# Patient Record
Sex: Female | Born: 1935 | Race: Black or African American | Hispanic: No | Marital: Married
Health system: Southern US, Community
[De-identification: ages and names within clinical notes are randomized; demographics above are authoritative.]

---

## 2003-10-31 ENCOUNTER — Other Ambulatory Visit: Payer: Self-pay

## 2004-05-27 ENCOUNTER — Other Ambulatory Visit: Payer: Self-pay

## 2004-06-22 ENCOUNTER — Ambulatory Visit: Payer: Self-pay | Admitting: Internal Medicine

## 2005-02-16 ENCOUNTER — Ambulatory Visit: Payer: Self-pay | Admitting: Podiatry

## 2005-02-16 ENCOUNTER — Other Ambulatory Visit: Payer: Self-pay

## 2005-02-16 ENCOUNTER — Emergency Department: Payer: Self-pay | Admitting: Unknown Physician Specialty

## 2005-02-21 ENCOUNTER — Ambulatory Visit: Payer: Self-pay | Admitting: Internal Medicine

## 2005-04-14 ENCOUNTER — Ambulatory Visit: Payer: Self-pay | Admitting: Podiatry

## 2005-10-04 ENCOUNTER — Emergency Department: Payer: Self-pay | Admitting: Unknown Physician Specialty

## 2005-10-05 ENCOUNTER — Ambulatory Visit: Payer: Self-pay | Admitting: Internal Medicine

## 2006-01-13 ENCOUNTER — Emergency Department: Payer: Self-pay | Admitting: Emergency Medicine

## 2006-10-10 ENCOUNTER — Ambulatory Visit: Payer: Self-pay | Admitting: Internal Medicine

## 2006-11-28 IMAGING — CT CT CHEST W/ CM
1 series · 15 of 32 positions shown, 19 images · non-contrast
Comparison: none

REASON FOR EXAM: Eval for PE  SOB  Call Report [id number]
COMMENTS:

[Series 4: soft tissue · axial · 0.60mm/px · z∈[-270,-18]mm · 15 of 96 slices shown, 19 images]
[im 8/96  mediastinal]
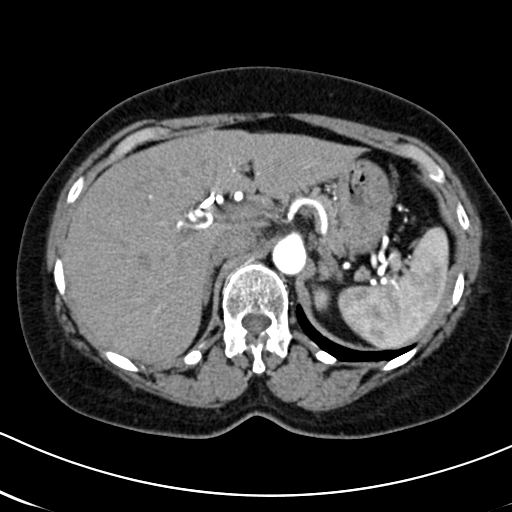
[im 8/96  lung]
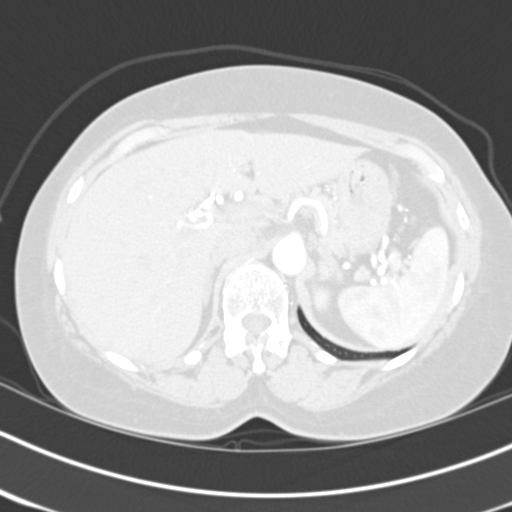
[im 15/96  lung]
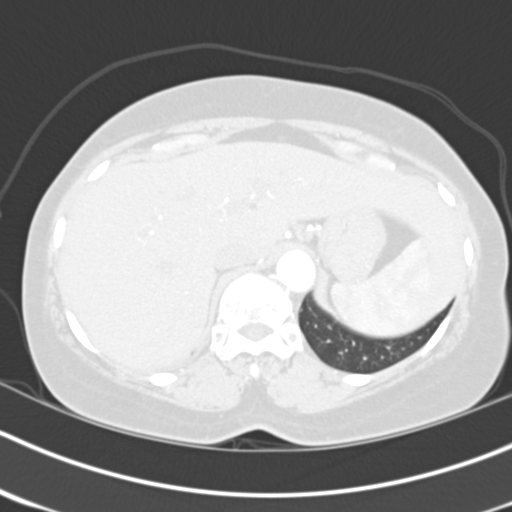
[im 20/96  lung]
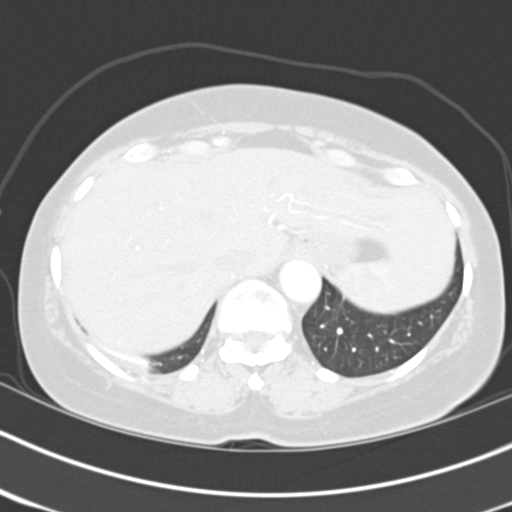
[im 25/96  lung]
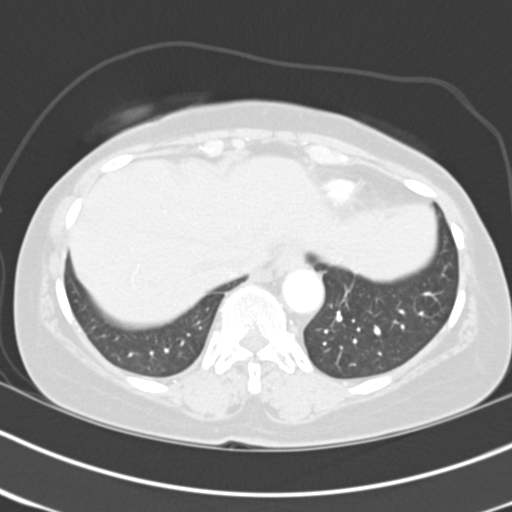
[im 32/96  mediastinal]
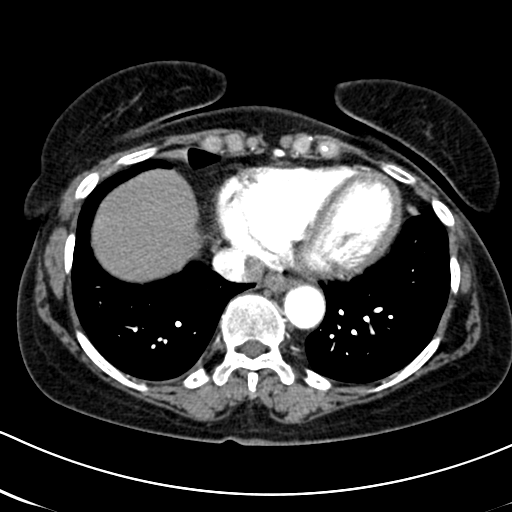
[im 32/96  lung]
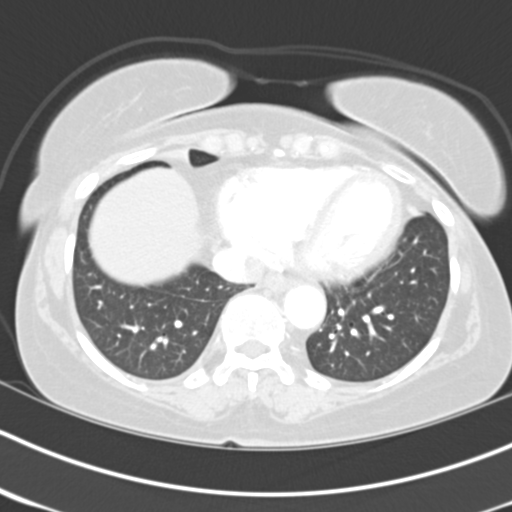
[im 39/96  lung]
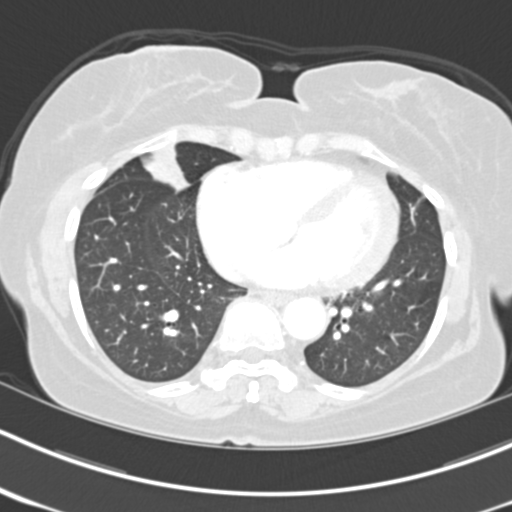
[im 46/96  lung]
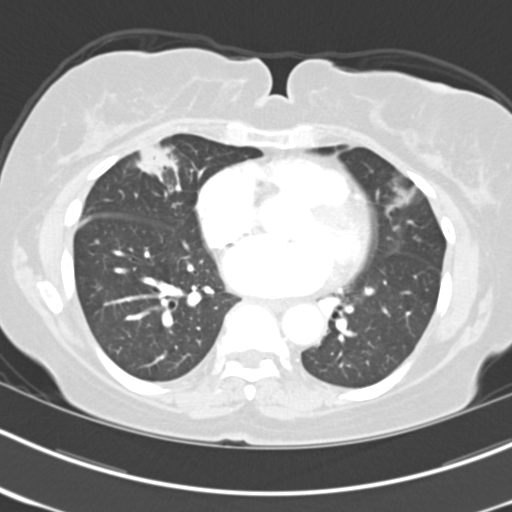
[im 51/96  lung]
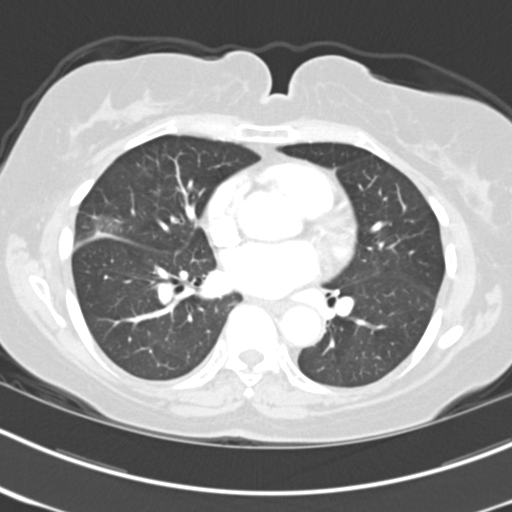
[im 57/96  mediastinal]
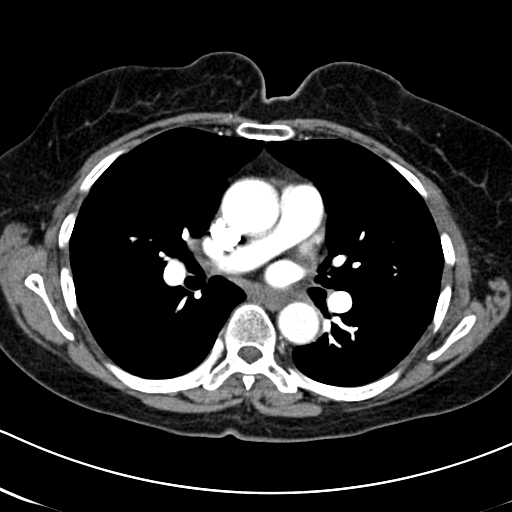
[im 57/96  lung]
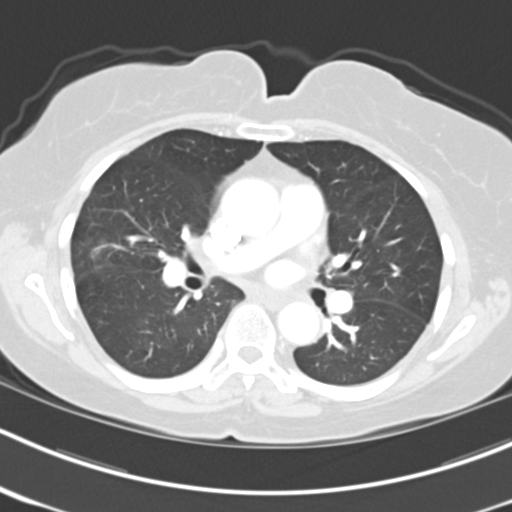
[im 60/96  lung]
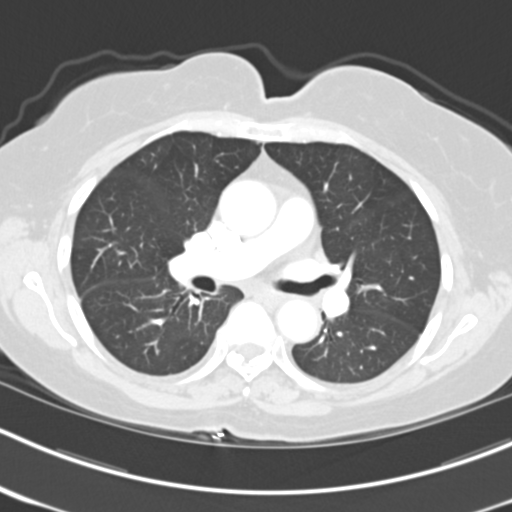
[im 67/96  lung]
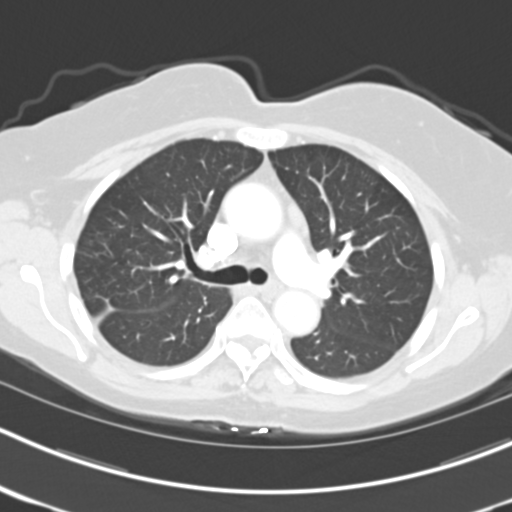
[im 74/96  lung]
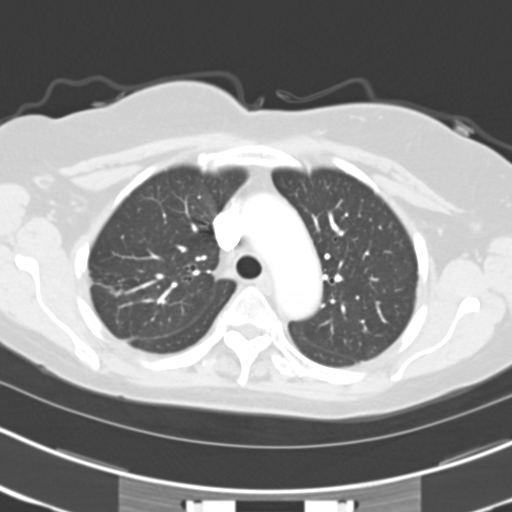
[im 78/96  mediastinal]
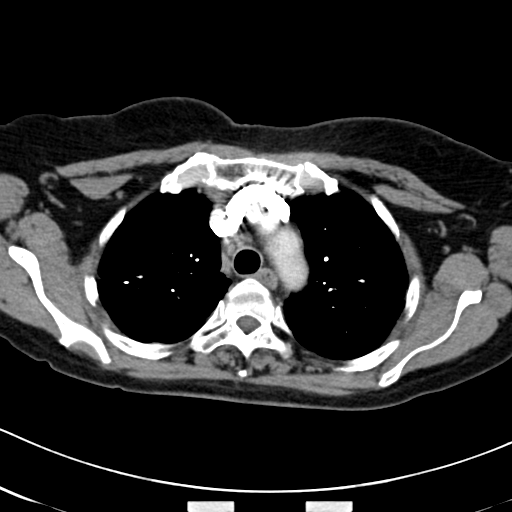
[im 78/96  lung]
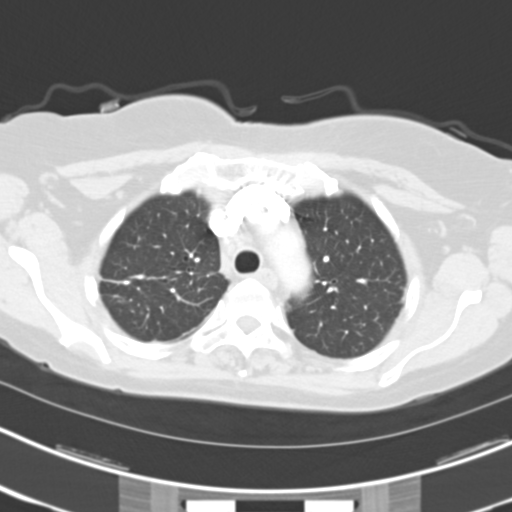
[im 85/96  lung]
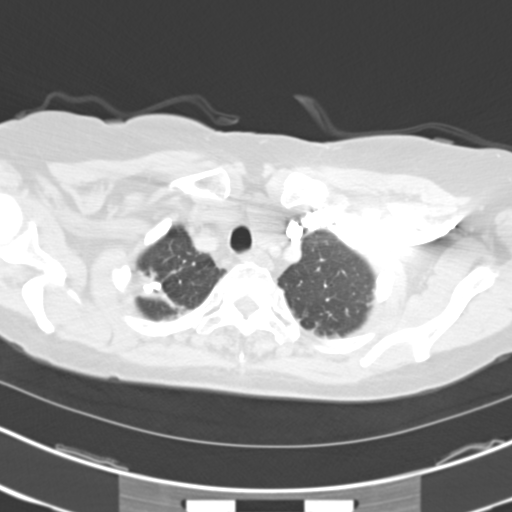
[im 92/96  lung]
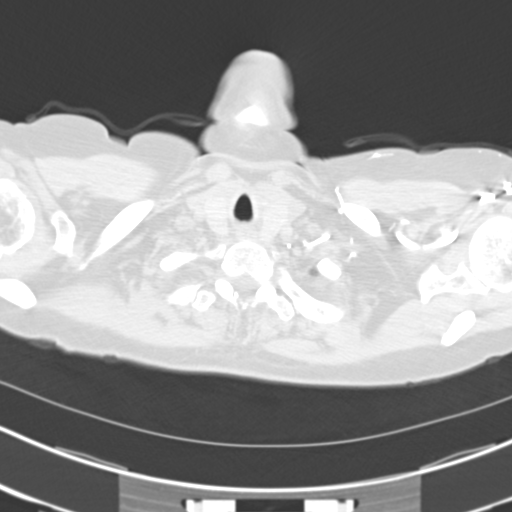

[15 of 32 positions shown; findings below may reference images not displayed]

PROCEDURE:     CT  - CT CHEST (FOR PE) W  - February 21, 2005  [DATE]

RESULT:     Standard IV contrast chest CT is obtained.  Multiple small
anterior and medial mediastinal lymph nodes are noted. These measure up to
1.5 cm and are nonspecific using CT criteria.  Similar findings are noted in
the hilar regions.  Pleural parenchymal thickening is noted bilaterally
consistent with scarring.  Calcifications are noted and RIGHT apical pleural
parenchymal thickening most likely secondary to prior granulomatous disease
and scarring.  Patchy densities are noted in the lung bases.  These are
present bilaterally and could represent focal areas of pneumonia.
Malignancy cannot be excluded.  Follow up chest CT is recommended to
demonstrate complete clearing.
IMPRESSION: 1.     No evidence of pulmonary embolus.
2.     Bilateral patchy pulmonary infiltrates, most consistent with
pneumonia.  Active granulomatous disease and malignancy cannot be excluded.
Follow up chest CT recommended to demonstrate complete clearing.
3.     Evidence of pleural parenchymal scarring and prior granulomatous
disease.
4.     Nonspecific lymph nodes in the mediastinum.

## 2007-11-22 ENCOUNTER — Ambulatory Visit: Payer: Self-pay | Admitting: Internal Medicine

## 2007-11-27 ENCOUNTER — Ambulatory Visit: Payer: Self-pay | Admitting: Gastroenterology

## 2008-11-25 ENCOUNTER — Ambulatory Visit: Payer: Self-pay | Admitting: Internal Medicine

## 2009-11-26 ENCOUNTER — Ambulatory Visit: Payer: Self-pay | Admitting: Internal Medicine

## 2010-08-28 ENCOUNTER — Emergency Department: Payer: Self-pay | Admitting: Unknown Physician Specialty

## 2010-11-10 ENCOUNTER — Emergency Department: Payer: Self-pay | Admitting: Emergency Medicine

## 2011-01-19 ENCOUNTER — Ambulatory Visit: Payer: Self-pay | Admitting: Internal Medicine

## 2011-06-17 ENCOUNTER — Ambulatory Visit: Payer: Self-pay | Admitting: Ophthalmology

## 2011-06-29 ENCOUNTER — Ambulatory Visit: Payer: Self-pay | Admitting: Ophthalmology

## 2011-07-17 ENCOUNTER — Emergency Department: Payer: Self-pay | Admitting: Internal Medicine

## 2012-06-03 IMAGING — CR DG LUMBAR SPINE 2-3V
1 series · 3 of 3 positions shown · non-contrast
Comparison: none

REASON FOR EXAM: back pain
COMMENTS:   LMP: Post-Menopausal

PROCEDURE:     DXR - DXR LUMBAR SPINE AP AND LATERAL  - August 28, 2010  [DATE]
RESULT:     Comparison: None

[Series 1: view not recorded · 0.17mm/px · 3 of 3 slices shown]
[im 1/3]
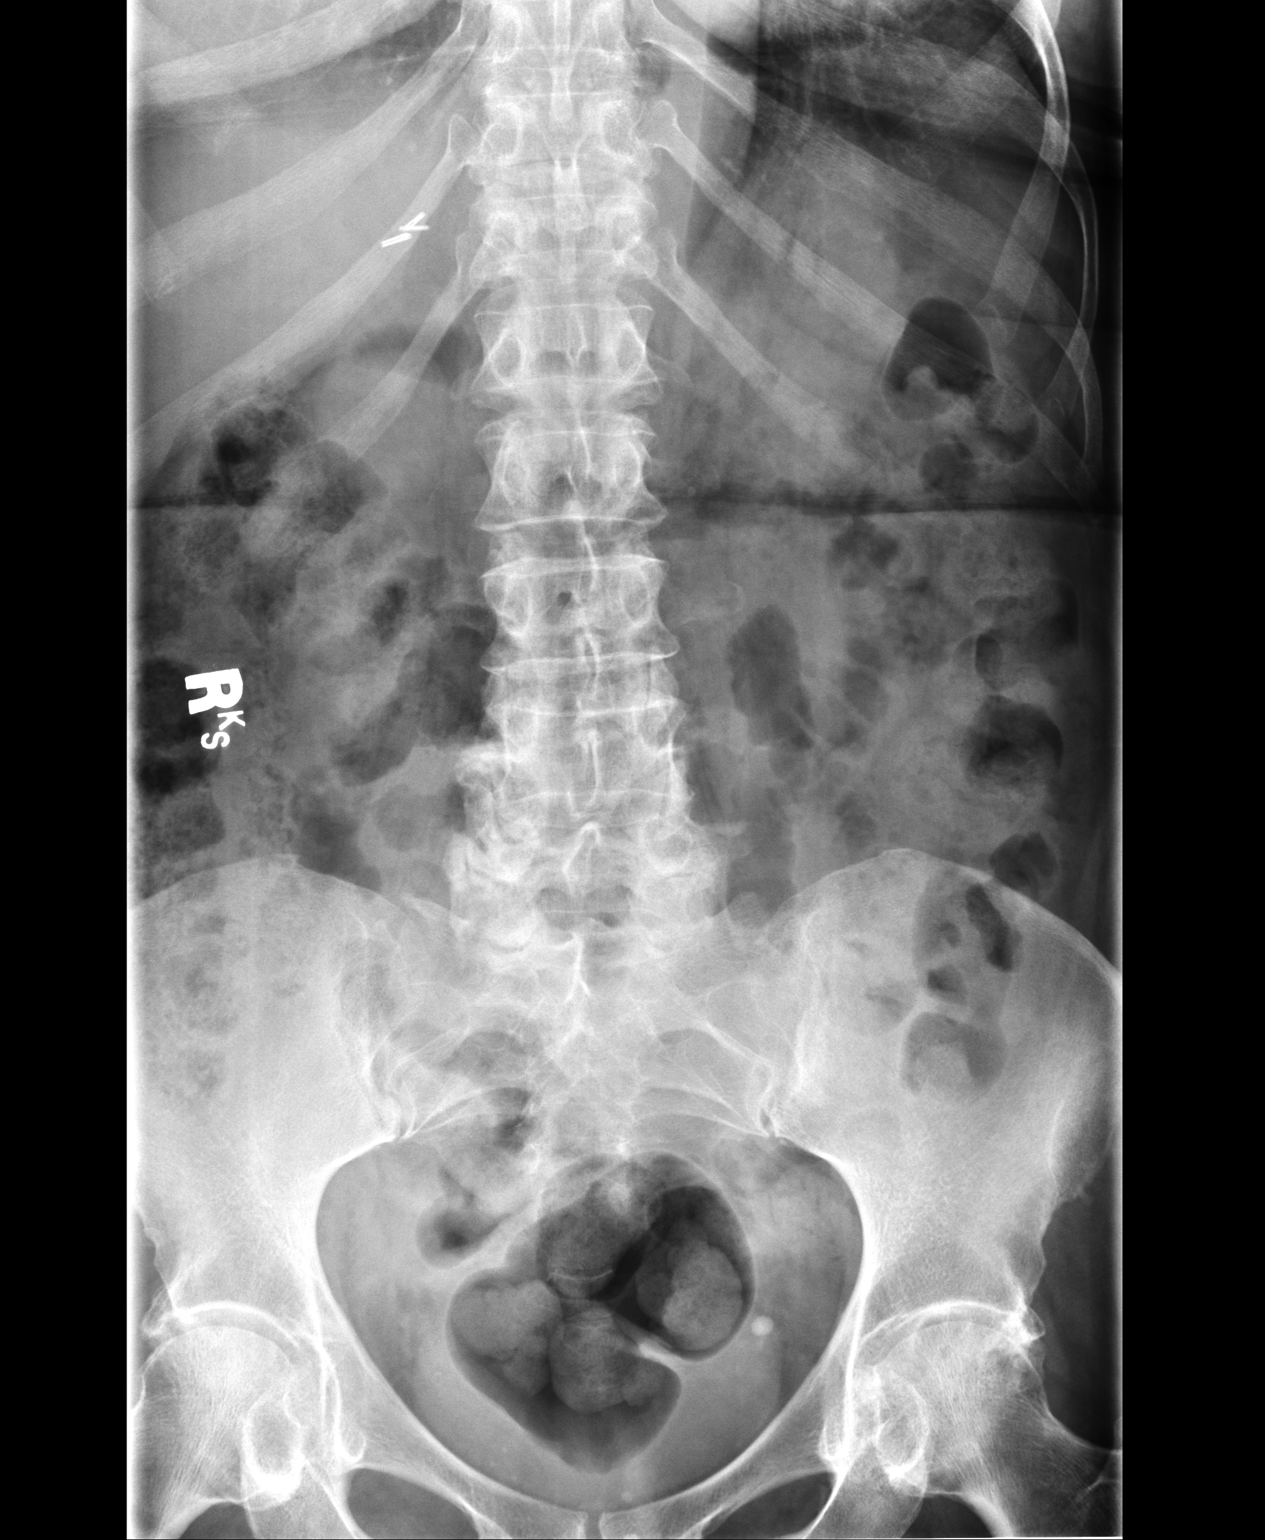
[im 2/3]
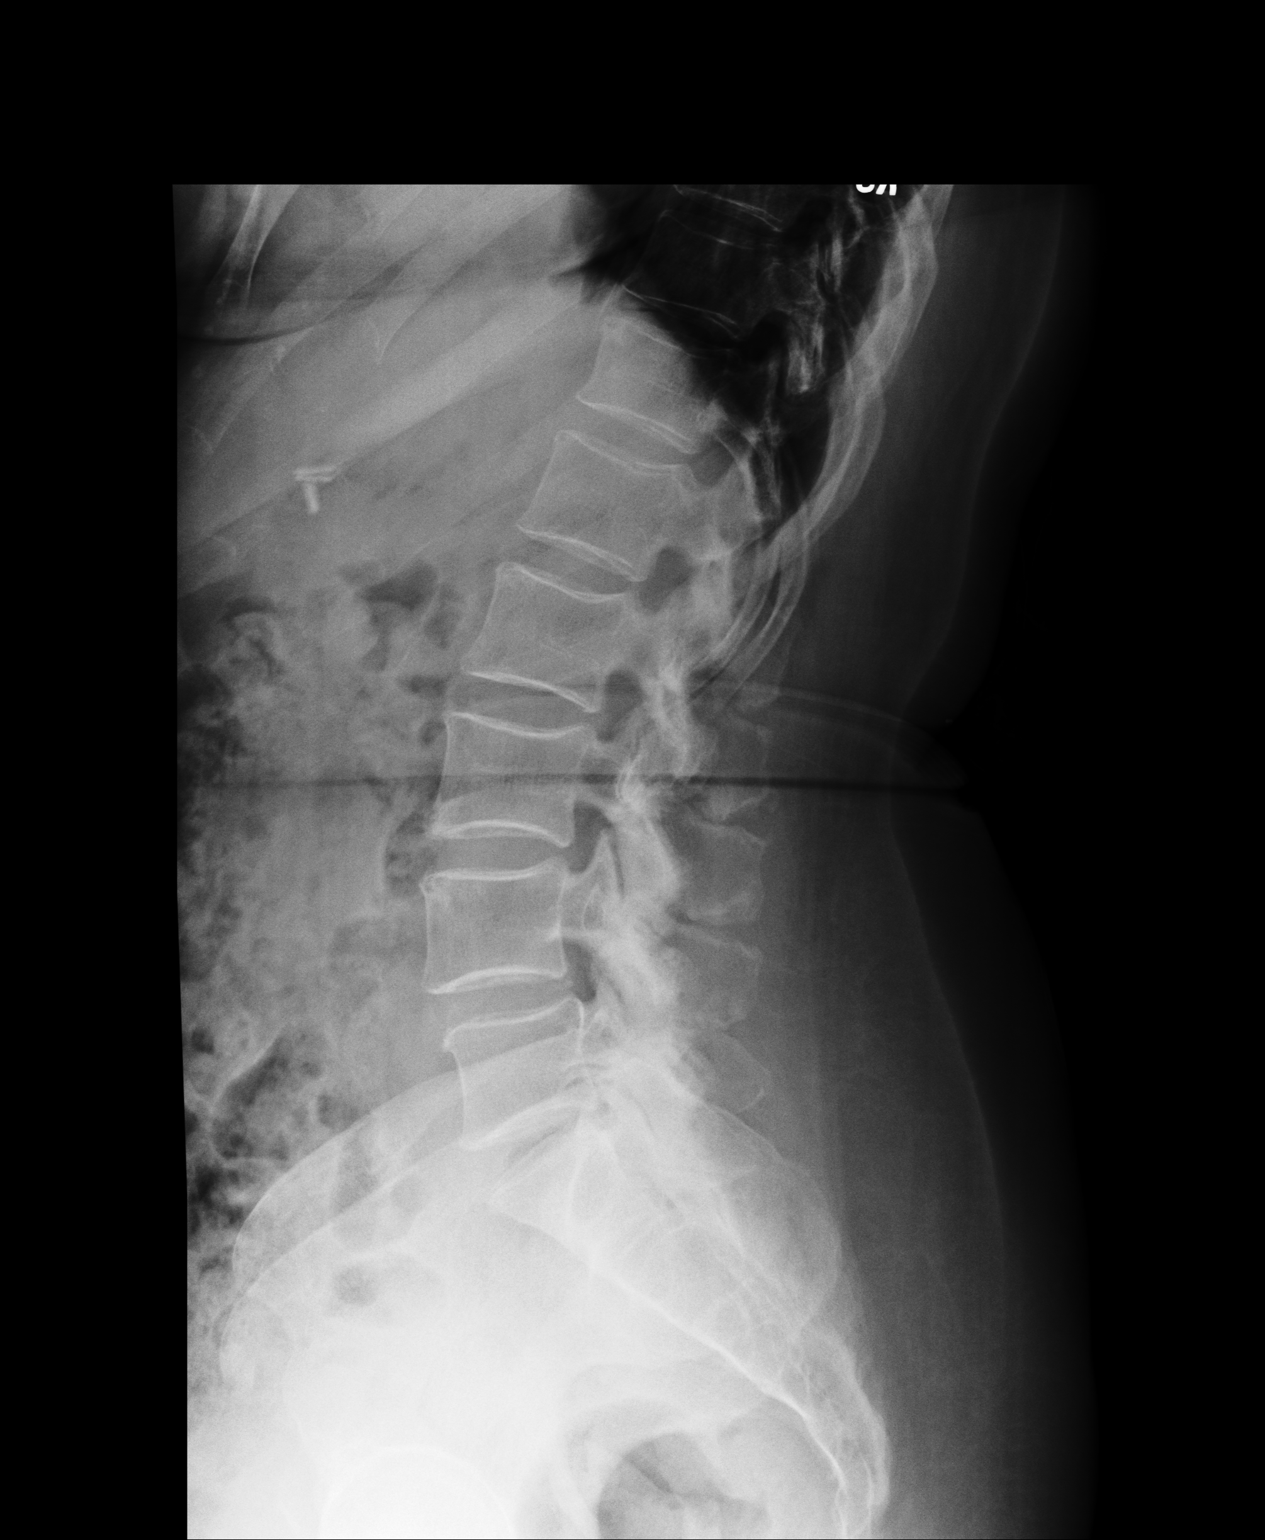
[im 3/3]
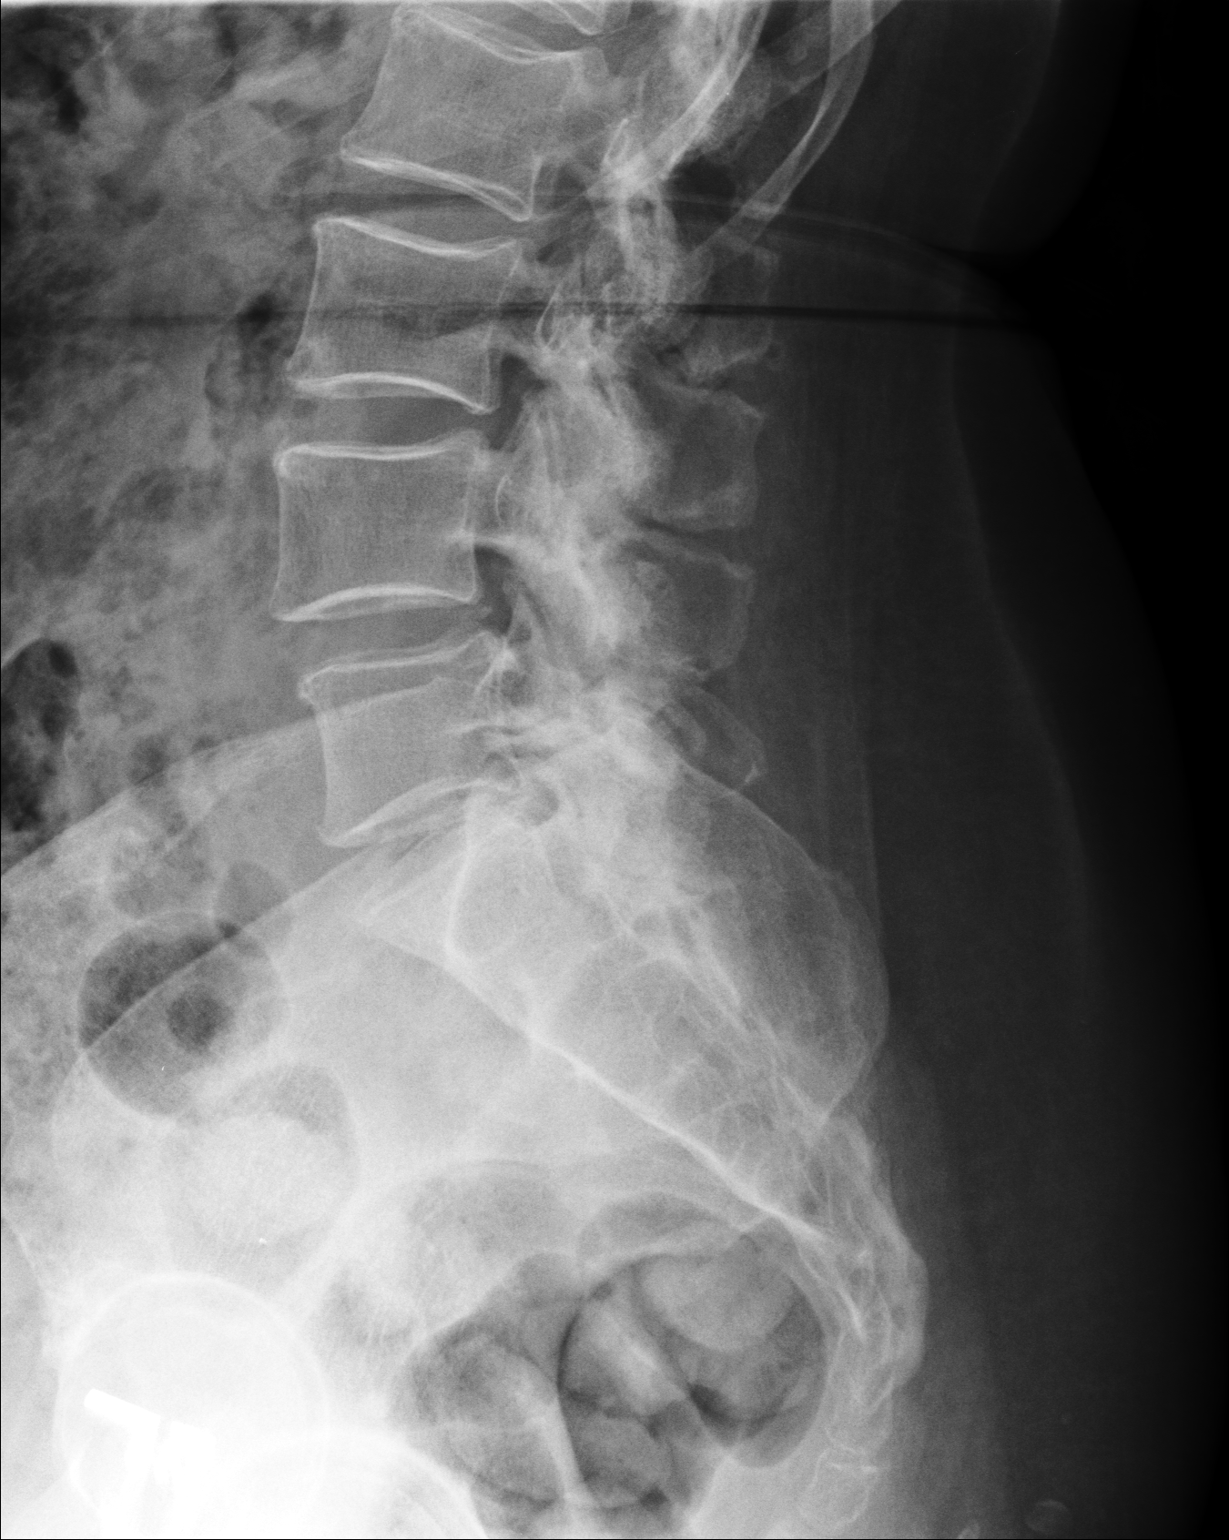

[3 of 3 positions shown; findings below may reference images not displayed]

FINDINGS: AP and lateral views of the lumbar spine and a coned down view of the
lumbosacral junction are provided.

There are 5 nonrib bearing lumbar-type vertebral bodies. The vertebral body
heights are maintained. The alignment is anatomic. There is no
spondylolysis. There is no acute fracture or static listhesis. The disc
spaces are maintained. There is facet arthropathy of the lumbar spine.

The SI joints are unremarkable.
IMPRESSION: 1. No acute osseous abnormality of the lumbar spine.

## 2012-08-21 ENCOUNTER — Ambulatory Visit: Payer: Self-pay | Admitting: Ophthalmology

## 2012-08-21 LAB — POTASSIUM: Potassium: 3.6 mmol/L (ref 3.5–5.1)

## 2012-08-29 ENCOUNTER — Ambulatory Visit: Payer: Self-pay | Admitting: Ophthalmology

## 2013-04-22 IMAGING — CR DG CHEST 2V
1 series · 2 of 2 positions shown · non-contrast
Comparison: none

REASON FOR EXAM: Respiratory distress
COMMENTS:

PROCEDURE:     DXR - DXR CHEST PA (OR AP) AND LATERAL  - July 17, 2011  [DATE]
RESULT:     Lungs are clear. Tiny calcified granuloma in right upper lobe.
Cardiovascular structures unremarkable.

[Series 1: w chest pa · 0.14mm/px · 2 of 2 slices shown]
[im 1/2]
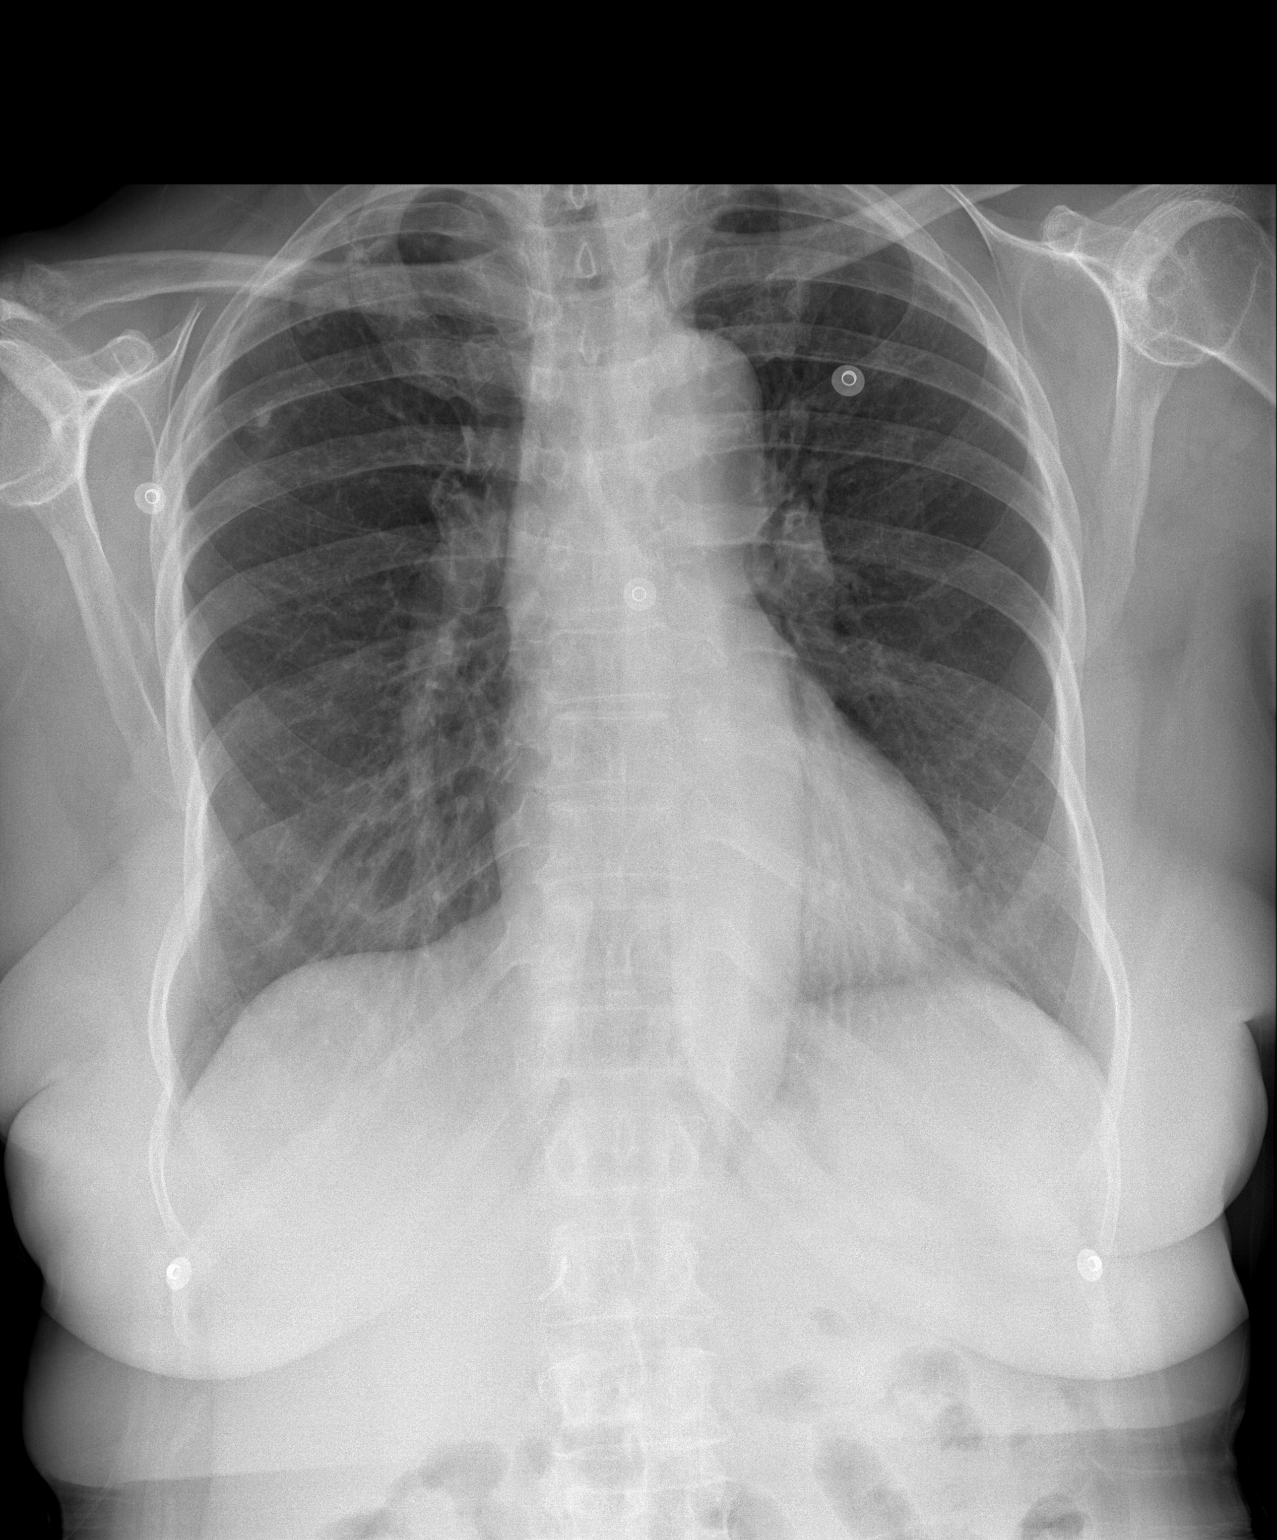
[im 2/2]
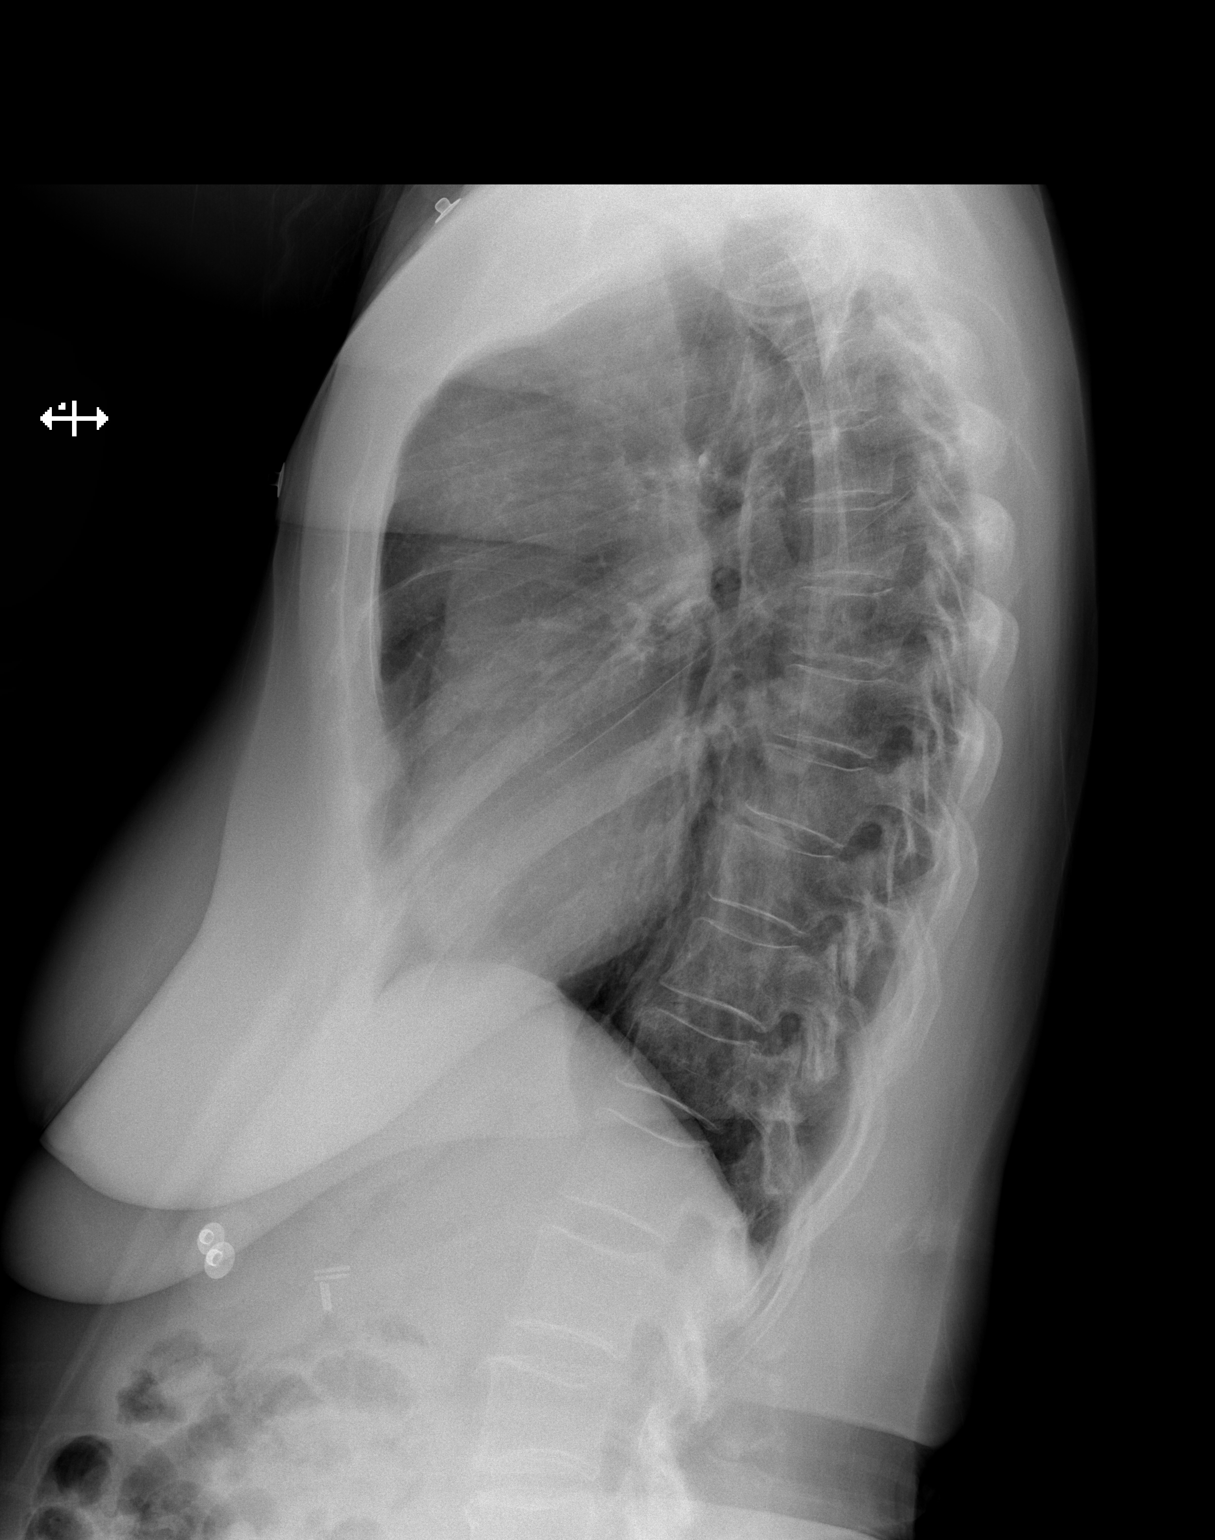

[2 of 2 positions shown; findings below may reference images not displayed]

IMPRESSION: No acute abnormality. Tiny calcified granuloma right upper
lobe. COPD cannot be excluded. Chest is stable from 11/10/2010.

## 2015-01-06 NOTE — Op Note (Signed)
PATIENT NAME:  Nicole Greene, Nicole MR#:  [id number] DATE OF BIRTH:  05-19-1932  DATE OF PROCEDURE:  08/29/2012  PREOPERATIVE DIAGNOSIS:  Senile cataract left eye.  POSTOPERATIVE DIAGNOSIS:  Senile cataract left eye.  PROCEDURE:  Phacoemulsification with posterior chamber intraocular lens implantation of the left eye.  LENS:  SN60WF 20.5-diopter posterior chamber intraocular lens.  ULTRASOUND TIME:  18 % of 1 minute, 20 seconds. CDE 14.2.  SURGEON:  [place] Vivi Piccirilli, MD  ANESTHESIA:  Topical with tetracaine drops and 2% Xylocaine jelly.  COMPLICATIONS:  None.  DESCRIPTION OF PROCEDURE:  The patient was identified in the holding room and transported to the operating room and placed in the supine position under the operating microscope.  The left eye was identified as the operative eye and it was prepped and draped in the usual sterile ophthalmic fashion.  A 1 millimeter clear-corneal paracentesis was made at the 1:30 position.  The anterior chamber was filled with Viscoat viscoelastic.  A 2.4 millimeter keratome was used to make a near-clear corneal incision at the 10:30 position.  A curvilinear capsulorrhexis was made with a cystotome and capsulorrhexis forceps.  Balanced salt solution was used to hydrodissect and hydrodelineate the nucleus.  Phacoemulsification was then used in horizontal chopping fashion to remove the lens nucleus and epinucleus.  The remaining cortex was then removed using the irrigation and aspiration handpiece. Provisc was then placed into the capsular bag to distend it for lens placement.  An SN60WF 20.5-diopter lens was then injected into the capsular bag.  The remaining viscoelastic was aspirated.  Wounds were hydrated with balanced salt solution.  The anterior chamber was inflated to a physiologic pressure with balanced salt solution.  Miostat was placed into the anterior chamber to constrict the pupil.  No wound leaks were noted.  Topical Vigamox drops and Maxitrol  ointment were applied to the eye. The patient was taken to the recovery room in stable condition without complications of anesthesia or surgery.   ____________________________ Deirdre Evenerhadwick R. Esbeydi Manago, MD crb:[id number] D: 08/29/2012 14:31:38 ET T: 08/29/2012 15:01:38 ET JOB#: [id number]  cc: Deirdre Evenerhadwick R. Jalan Bodi, MD, <Dictator> Lockie MolaHADWICK Cookie Pore MD ELECTRONICALLY SIGNED 09/05/2012 12:27
